# Patient Record
Sex: Female | Born: 2000 | Race: Black or African American | Hispanic: No | Marital: Single | Smoking: Never smoker
Health system: Southern US, Community
[De-identification: ages and names within clinical notes are randomized; demographics above are authoritative.]

---
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
## 2019-07-20 ENCOUNTER — Ambulatory Visit (INDEPENDENT_AMBULATORY_CARE_PROVIDER_SITE_OTHER): Payer: Managed Care, Other (non HMO) | Admitting: Podiatry

## 2019-07-20 ENCOUNTER — Other Ambulatory Visit: Payer: Self-pay

## 2019-07-20 VITALS — Temp 96.4°F

## 2019-07-20 DIAGNOSIS — B351 Tinea unguium: Secondary | ICD-10-CM | POA: Diagnosis not present

## 2019-07-20 LAB — HEPATIC FUNCTION PANEL
AG Ratio: 1.5 (calc) (ref 1.0–2.5)
ALT: 17 U/L (ref 5–32)
AST: 17 U/L (ref 12–32)
Albumin: 4.4 g/dL (ref 3.6–5.1)
Alkaline phosphatase (APISO): 82 U/L (ref 36–128)
Bilirubin, Direct: 0.1 mg/dL (ref 0.0–0.2)
Globulin: 3 g/dL (calc) (ref 2.0–3.8)
Indirect Bilirubin: 0.4 mg/dL (calc) (ref 0.2–1.1)
Total Bilirubin: 0.5 mg/dL (ref 0.2–1.1)
Total Protein: 7.4 g/dL (ref 6.3–8.2)

## 2019-07-21 ENCOUNTER — Encounter: Payer: Self-pay | Admitting: Podiatry

## 2019-07-21 MED ORDER — TERBINAFINE HCL 250 MG PO TABS: 250.0000 mg | ORAL_TABLET | Freq: Every day | ORAL | 0 refills | Status: DC

## 2019-07-21 NOTE — Progress Notes (Signed)
  Subjective:  Patient ID: Kristin Keller, female    DOB: 12-19-2000,  MRN: [id number]  Chief Complaint  Patient presents with  . Nail Problem    Thick, discolored, broken toenails - bilateral hallux. x2 months. Pt stated, "I had acrylic nails - they broke, and part of the toenails came off with them. No pain/bleeding/drainage. I just want my nails to grow back".    19 y.o. female presents with the above complaint.  Patient presents with a complaint of bilateral hallux fungus with mild trauma to the both hallux.  Patient states that she had acrylic nail that had broken off and the nail came out with thickened elongated dystrophic painful nail.  Patient stated the nail never grew back the same way.  The nail is still growing.  The injury happened with her acrylic nail about 3 months ago.  She would like to know if there is any treatment options to make the nail grow out more normal.  There is underlying fungal involvement as well.  She denies any other acute complaints.   Review of Systems: Negative except as noted in the HPI. Denies N/V/F/Ch.  No past medical history on file.  Current Outpatient Medications:  .  amitriptyline (ELAVIL) 10 MG tablet, Take 1 tablet (10 mg total) by mouth at bedtime. For postconcussion syndrome, Disp: 30 tablet, Rfl: 1 .  polyethylene glycol (MIRALAX) packet, Take 17 g by mouth daily., Disp: 14 each, Rfl: 0  Social History   Tobacco Use  Smoking Status Never Smoker    No Known Allergies Objective:   Vitals:   07/20/19 1510  Temp: (!) 96.4 F (35.8 C)   There is no height or weight on file to calculate BMI. Constitutional Well developed. Well nourished.  Vascular Dorsalis pedis pulses palpable bilaterally. Posterior tibial pulses palpable bilaterally. Capillary refill normal to all digits.  No cyanosis or clubbing noted. Pedal hair growth normal.  Neurologic Normal speech. Oriented to person, place, and time. Epicritic sensation to light touch  grossly present bilaterally.  Dermatologic  thickened elongated dystrophic nails x2 bilateral hallux.  Mild pain on palpation.  The nail not growing out of full length. Skin within normal limits  Orthopedic: Normal joint ROM without pain or crepitus bilaterally. No visible deformities. No bony tenderness.   Radiographs: None Assessment:   1. Onychomycosis due to dermatophyte   2. Nail fungus    Plan:  Patient was evaluated and treated and all questions answered.  Bilateral hallux onychomycosis with also underlying history of trauma -Educated the patient on the etiology of onychomycosis and various treatment options associated with improving the fungal load.  I explained to the patient that there is 3 treatment options available to treat the onychomycosis including topical, p.o., laser treatment.  Patient elected to undergo p.o. options with Lamisil/terbinafine therapy.  In order for me to start the medication therapy, I explained to the patient the importance of evaluating the liver and obtaining the liver function test.  Once the liver function test comes back normal I will start him on 67-month course of Lamisil therapy.  Patient understood all risk and would like to proceed with Lamisil therapy.  I have asked the patient to immediately stop the Lamisil therapy if she has any reactions to it and call the office or go to the emergency room right away.  Patient states understanding   No follow-ups on file.

## 2019-11-13 ENCOUNTER — Ambulatory Visit: Payer: Managed Care, Other (non HMO) | Admitting: Podiatry

## 2019-11-27 ENCOUNTER — Ambulatory Visit (INDEPENDENT_AMBULATORY_CARE_PROVIDER_SITE_OTHER): Payer: Managed Care, Other (non HMO) | Admitting: Podiatry

## 2019-11-27 ENCOUNTER — Other Ambulatory Visit: Payer: Self-pay

## 2019-11-27 ENCOUNTER — Encounter: Payer: Self-pay | Admitting: Podiatry

## 2019-11-27 DIAGNOSIS — B351 Tinea unguium: Secondary | ICD-10-CM

## 2019-11-27 MED ORDER — TERBINAFINE HCL 250 MG PO TABS: 250.0000 mg | ORAL_TABLET | Freq: Every day | ORAL | 0 refills | Status: AC

## 2019-11-27 NOTE — Progress Notes (Signed)
  Subjective:  Patient ID: Kristin Keller, female    DOB: 2000/08/22,  MRN: [id number]  No chief complaint on file.   19 y.o. female presents with the above complaint.  Patient presents with follow-up of bilateral complaint of hallux fungus/onychomycosis.  Patient states that she is doing a lot better.  She only has taken 30-day course for which she has had significant improvement.  She would like to finish the rest of the course but need a refill for it.  She denies any other acute complaints.   Review of Systems: Negative except as noted in the HPI. Denies N/V/F/Ch.  History reviewed. No pertinent past medical history.  Current Outpatient Medications:  .  amitriptyline (ELAVIL) 10 MG tablet, Take 1 tablet (10 mg total) by mouth at bedtime. For postconcussion syndrome, Disp: 30 tablet, Rfl: 1 .  polyethylene glycol (MIRALAX) packet, Take 17 g by mouth daily., Disp: 14 each, Rfl: 0 .  terbinafine (LAMISIL) 250 MG tablet, Take 1 tablet (250 mg total) by mouth daily., Disp: 60 tablet, Rfl: 0  Social History   Tobacco Use  Smoking Status Never Smoker    No Known Allergies Objective:   There were no vitals filed for this visit. There is no height or weight on file to calculate BMI. Constitutional Well developed. Well nourished.  Vascular Dorsalis pedis pulses palpable bilaterally. Posterior tibial pulses palpable bilaterally. Capillary refill normal to all digits.  No cyanosis or clubbing noted. Pedal hair growth normal.  Neurologic Normal speech. Oriented to person, place, and time. Epicritic sensation to light touch grossly present bilaterally.  Dermatologic  thickened elongated dystrophic nails x2 bilateral hallux which is improving.  Mild pain on palpation.  The nail not growing out of full length. Skin within normal limits  Orthopedic: Normal joint ROM without pain or crepitus bilaterally. No visible deformities. No bony tenderness.   Radiographs: None Assessment:    1. Onychomycosis due to dermatophyte   2. Nail fungus    Plan:  Patient was evaluated and treated and all questions answered.  Bilateral hallux onychomycosis with also underlying history of trauma -Patient has had some recurring issues about picking up the Lamisil and therefore she has only done 30-day course so far.  Patient has had great improvement from 30 days.  I encouraged her to finish the 60 days more for total time of 90 days.  Patient agrees with the plan.  60-day Lamisil course was sent to the pharmacy. -Lamisil was sent to the pharmacy   No follow-ups on file.

## 2020-03-02 ENCOUNTER — Ambulatory Visit: Payer: Managed Care, Other (non HMO) | Admitting: Podiatry
